# Patient Record
Sex: Female | Born: 1955 | Race: White | Hispanic: No | Marital: Married | Smoking: Never smoker
Health system: Southern US, Community
[De-identification: ages and names within clinical notes are randomized; demographics above are authoritative.]

## PROBLEM LIST (undated history)

## (undated) DIAGNOSIS — R011 Cardiac murmur, unspecified: Secondary | ICD-10-CM

## (undated) DIAGNOSIS — M810 Age-related osteoporosis without current pathological fracture: Secondary | ICD-10-CM

## (undated) DIAGNOSIS — N3090 Cystitis, unspecified without hematuria: Secondary | ICD-10-CM

## (undated) HISTORY — PX: TUBAL LIGATION: SHX77

## (undated) HISTORY — DX: Cardiac murmur, unspecified: R01.1

## (undated) HISTORY — PX: ABDOMINAL HYSTERECTOMY: SHX81

## (undated) HISTORY — DX: Age-related osteoporosis without current pathological fracture: M81.0

---

## 1998-11-06 ENCOUNTER — Ambulatory Visit (HOSPITAL_COMMUNITY): Admission: RE | Admit: 1998-11-06 | Discharge: 1998-11-06 | Payer: Self-pay | Admitting: Family Medicine

## 1998-11-06 ENCOUNTER — Encounter: Payer: Self-pay | Admitting: Family Medicine

## 1999-01-14 ENCOUNTER — Other Ambulatory Visit: Admission: RE | Admit: 1999-01-14 | Discharge: 1999-01-14 | Payer: Self-pay | Admitting: Obstetrics and Gynecology

## 1999-02-27 ENCOUNTER — Inpatient Hospital Stay (HOSPITAL_COMMUNITY): Admission: RE | Admit: 1999-02-27 | Discharge: 1999-03-01 | Payer: Self-pay | Admitting: Obstetrics and Gynecology

## 1999-03-11 ENCOUNTER — Ambulatory Visit (HOSPITAL_COMMUNITY): Admission: RE | Admit: 1999-03-11 | Discharge: 1999-03-11 | Payer: Self-pay | Admitting: Obstetrics and Gynecology

## 1999-03-11 ENCOUNTER — Encounter: Payer: Self-pay | Admitting: Obstetrics and Gynecology

## 2000-03-17 ENCOUNTER — Encounter: Payer: Self-pay | Admitting: Obstetrics and Gynecology

## 2000-03-17 ENCOUNTER — Ambulatory Visit (HOSPITAL_COMMUNITY): Admission: RE | Admit: 2000-03-17 | Discharge: 2000-03-17 | Payer: Self-pay | Admitting: Obstetrics and Gynecology

## 2000-07-01 ENCOUNTER — Other Ambulatory Visit: Admission: RE | Admit: 2000-07-01 | Discharge: 2000-07-01 | Payer: Self-pay | Admitting: Obstetrics and Gynecology

## 2001-03-29 ENCOUNTER — Encounter: Payer: Self-pay | Admitting: Obstetrics and Gynecology

## 2001-03-29 ENCOUNTER — Ambulatory Visit (HOSPITAL_COMMUNITY): Admission: RE | Admit: 2001-03-29 | Discharge: 2001-03-29 | Payer: Self-pay | Admitting: Obstetrics and Gynecology

## 2001-08-31 ENCOUNTER — Encounter (INDEPENDENT_AMBULATORY_CARE_PROVIDER_SITE_OTHER): Payer: Self-pay | Admitting: *Deleted

## 2001-08-31 ENCOUNTER — Ambulatory Visit (HOSPITAL_COMMUNITY): Admission: RE | Admit: 2001-08-31 | Discharge: 2001-08-31 | Payer: Self-pay | Admitting: Gastroenterology

## 2002-01-10 ENCOUNTER — Other Ambulatory Visit: Admission: RE | Admit: 2002-01-10 | Discharge: 2002-01-10 | Payer: Self-pay | Admitting: Obstetrics and Gynecology

## 2003-02-27 ENCOUNTER — Encounter: Payer: Self-pay | Admitting: Obstetrics and Gynecology

## 2003-02-27 ENCOUNTER — Ambulatory Visit (HOSPITAL_COMMUNITY): Admission: RE | Admit: 2003-02-27 | Discharge: 2003-02-27 | Payer: Self-pay | Admitting: Obstetrics and Gynecology

## 2004-07-09 ENCOUNTER — Encounter: Admission: RE | Admit: 2004-07-09 | Discharge: 2004-07-09 | Payer: Self-pay | Admitting: Obstetrics and Gynecology

## 2004-07-19 ENCOUNTER — Ambulatory Visit (HOSPITAL_COMMUNITY): Admission: RE | Admit: 2004-07-19 | Discharge: 2004-07-19 | Payer: Self-pay | Admitting: Gastroenterology

## 2005-08-05 ENCOUNTER — Encounter: Admission: RE | Admit: 2005-08-05 | Discharge: 2005-08-05 | Payer: Self-pay | Admitting: Obstetrics and Gynecology

## 2006-06-18 ENCOUNTER — Encounter: Admission: RE | Admit: 2006-06-18 | Discharge: 2006-06-18 | Payer: Self-pay | Admitting: Obstetrics and Gynecology

## 2006-08-26 ENCOUNTER — Encounter: Admission: RE | Admit: 2006-08-26 | Discharge: 2006-08-26 | Payer: Self-pay | Admitting: Obstetrics and Gynecology

## 2007-09-17 ENCOUNTER — Encounter: Admission: RE | Admit: 2007-09-17 | Discharge: 2007-09-17 | Payer: Self-pay | Admitting: Obstetrics and Gynecology

## 2008-09-21 ENCOUNTER — Ambulatory Visit (HOSPITAL_COMMUNITY): Admission: RE | Admit: 2008-09-21 | Discharge: 2008-09-21 | Payer: Self-pay | Admitting: Obstetrics and Gynecology

## 2008-10-25 ENCOUNTER — Encounter: Admission: RE | Admit: 2008-10-25 | Discharge: 2008-10-25 | Payer: Self-pay | Admitting: Dermatology

## 2009-05-15 ENCOUNTER — Encounter: Admission: RE | Admit: 2009-05-15 | Discharge: 2009-05-15 | Payer: Self-pay | Admitting: Interventional Radiology

## 2009-09-11 ENCOUNTER — Encounter: Admission: RE | Admit: 2009-09-11 | Discharge: 2009-09-11 | Payer: Self-pay | Admitting: Interventional Radiology

## 2009-11-21 ENCOUNTER — Encounter: Admission: RE | Admit: 2009-11-21 | Discharge: 2009-11-21 | Payer: Self-pay | Admitting: Interventional Radiology

## 2009-11-28 ENCOUNTER — Encounter: Admission: RE | Admit: 2009-11-28 | Discharge: 2009-11-28 | Payer: Self-pay | Admitting: Interventional Radiology

## 2009-12-26 ENCOUNTER — Encounter: Admission: RE | Admit: 2009-12-26 | Discharge: 2009-12-26 | Payer: Self-pay | Admitting: Interventional Radiology

## 2010-05-22 ENCOUNTER — Encounter: Admission: RE | Admit: 2010-05-22 | Discharge: 2010-05-22 | Payer: Self-pay | Admitting: Interventional Radiology

## 2010-10-24 ENCOUNTER — Ambulatory Visit (HOSPITAL_COMMUNITY): Admission: RE | Admit: 2010-10-24 | Discharge: 2010-10-24 | Payer: Self-pay | Admitting: Obstetrics and Gynecology

## 2011-01-11 ENCOUNTER — Encounter: Payer: Self-pay | Admitting: Obstetrics and Gynecology

## 2011-01-13 ENCOUNTER — Encounter: Payer: Self-pay | Admitting: Obstetrics and Gynecology

## 2011-01-14 IMAGING — US IR TRANSCATH EMBOLIZATION NON-NEURO EA OP FIELD
1 series · 4 of 4 positions shown · non-contrast
Comparison: none

CLINICAL DATA: Chronic venous insufficiency of both lower
extremities with more significant symptoms on the right.
Evaluation has demonstrated insufficiency of the great saphenous
vein and short saphenous vein.

[Series 1: ir transcath embolization non-neuro ea op field · 0.08mm/px · 4 of 4 slices shown]
[im 1/4]
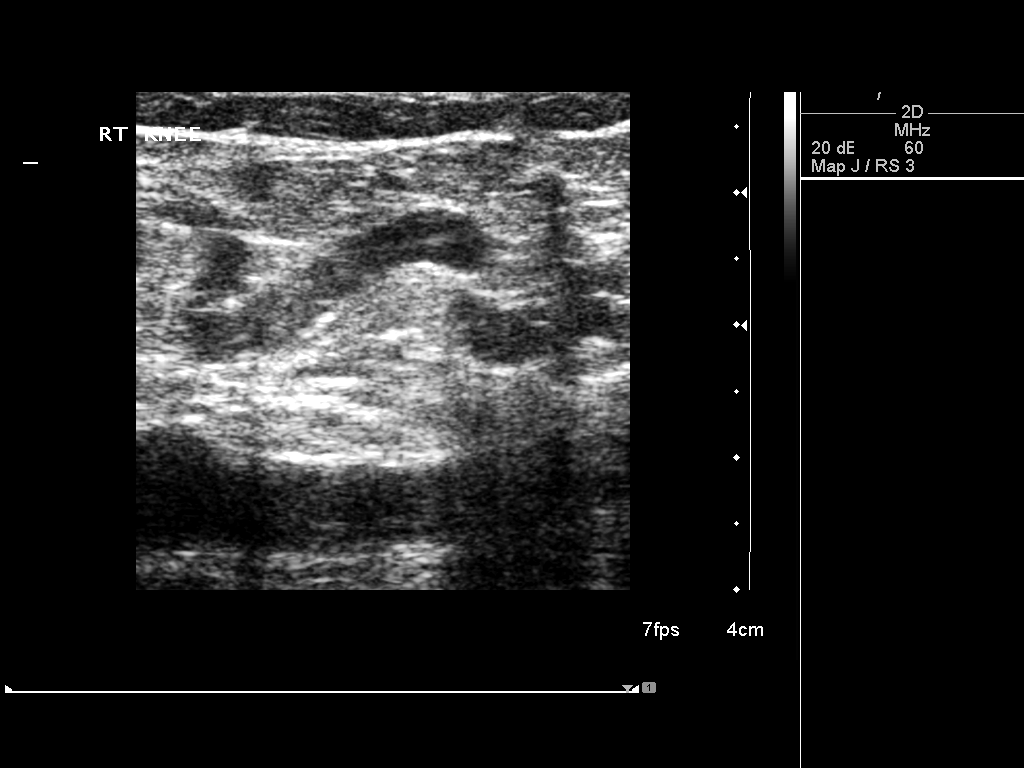
[im 2/4]
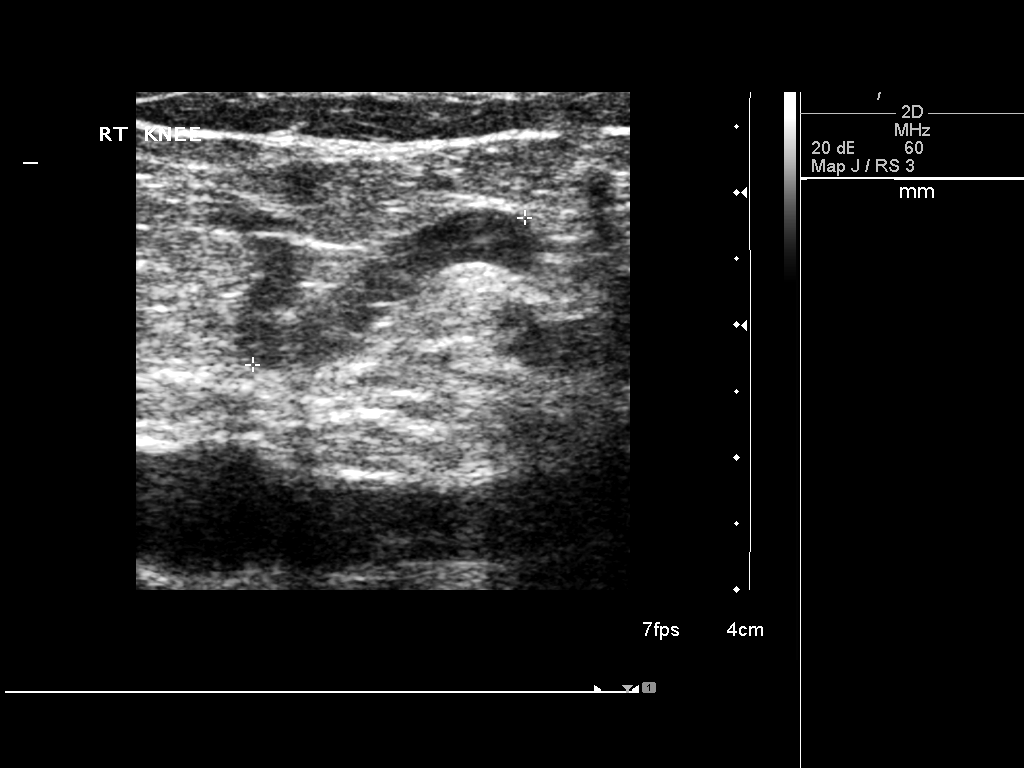
[im 3/4]
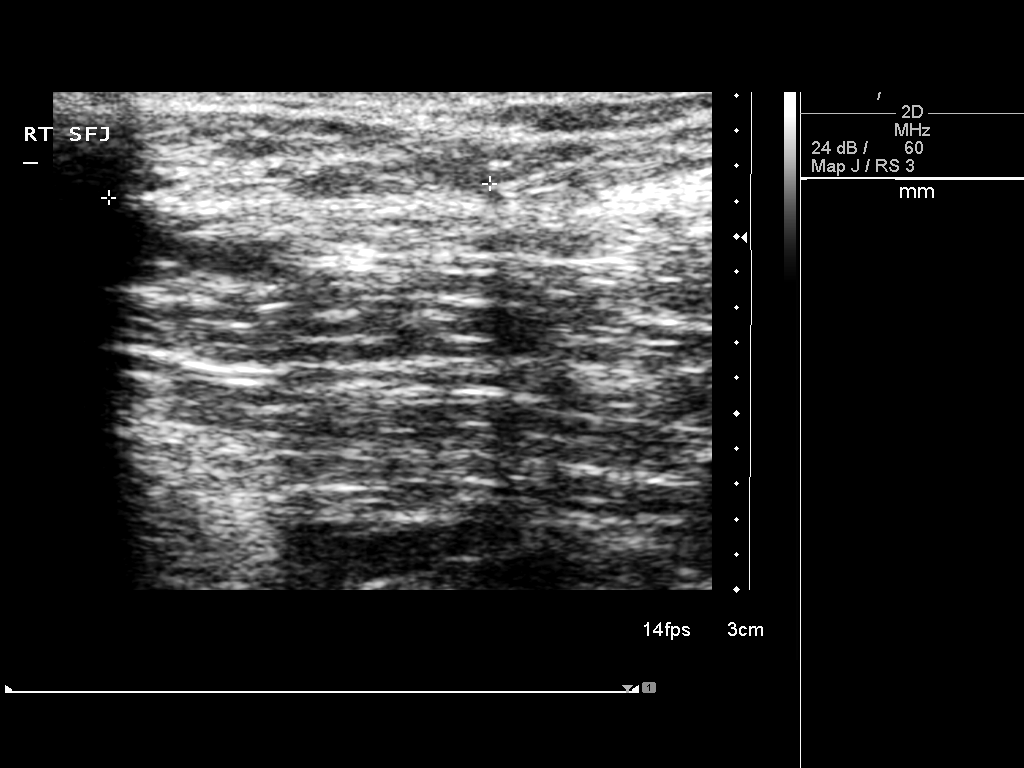
[im 4/4]
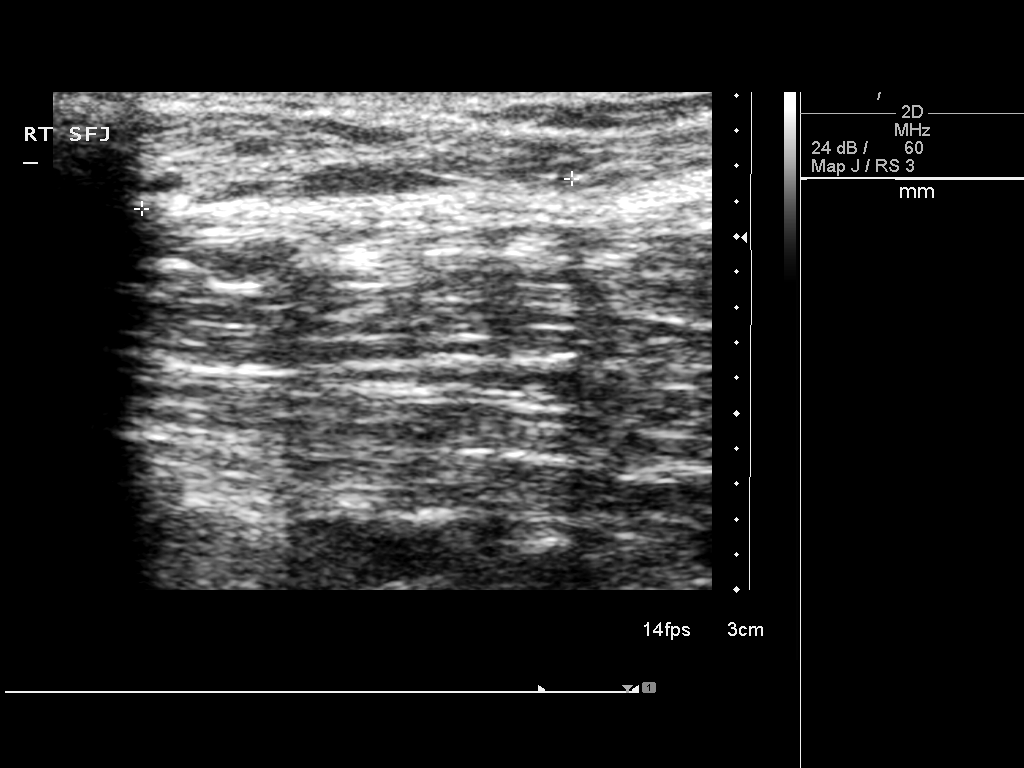

[4 of 4 positions shown; findings below may reference images not displayed]

1.  TRANSCATHETER OCCLUSION OF THE RIGHT GREAT SAPHENOUS VEIN
2.  ULTRASOUND-GUIDED FOAM SCLEROTHERAPY OF RIGHT SHORT SAPHENOUS
VEIN AND LOWER EXTREMITY VARICOSITIES

Procedure:  The procedure, risks, benefits, and alternatives were
explained to the patient.  Questions regarding the procedure were
encouraged and answered.  The patient understands and consents to
the procedure.

Ultrasound was used to localize the right great saphenous vein.
The course of the saphenous vein was marked on the skin.  The
extremity was prepped with Betadine in a sterile fashion, and a
sterile drape was applied covering the operative field.  A sterile
gown and sterile gloves were used for the procedure.  Local
anesthesia was provided with 1% Lidocaine.  Ultrasound image
documentation was performed.

Laser fiber diameter:  600 microns

Laser power:  10 Pabon

Total laser energy:  6199 joules

Total laser time:  278 seconds

Distance of laser tip from saphenofemoral junction:  25 mm

Tumescent anesthetic:  530 ml

Sclerosant:  2.0 ml SALEHI

Under direct ultrasound guidance, the great saphenous vein was
accessed at the distal calf level with a 21 gauge needle and
microaccess kit.  A guide wire was then advanced under ultrasound
guidance in the saphenous vein to the level of the saphenofemoral
junction.

A 60 cm, 4-French sheath was advanced over the wire and under
ultrasound guidance to the level of the saphenofemoral junction.
The sheath was slightly retracted.  A laser fiber was then
introduced and exposed from the tip of the sheath.  The laser fiber
was then adjusted and measurement made from the tip of the laser
fiber to the saphenofemoral junction.  This distance was documented
by ultrasound.

Tumescent anesthetic was then applied with the use of an automated
pump [DATE] gauge needle.  Anesthetic was applied along the
entire treatment course of the saphenous vein all the way to the
level of the saphenofemoral junction.  Ultrasound documentation was
performed during tumescent administration to follow spread of
anesthetic along the course of the saphenous vein.

Laser ablation of the saphenous vein was then performed by gradual
retraction of the laser fiber and sheath.  Ablation was performed
to the level of the sheath entry site.  The sheath and laser were
removed and manual compression held over the skin puncture site.

Foam sclerotherapy was performed of the short saphenous vein behind
the knee as well as adjacent communicating perforator vein and
varicosities.  Foamed SALEHI was injected under direct ultrasound
guidance into patent varicosities.

Complications:  None
FINDINGS: Laser ablation of the great saphenous vein was
uncomplicated.  The saphenous vein was punctured as low as possible
for treatment.  Total treatment length of the great saphenous vein
was 57 cm.

Inspection of the short saphenous vein demonstrates significant
tortuosity with lack of sufficient straight segment to allow
advancement of a laser fiber.  The longest straight segment
measured only 2.3 cm in length.  Foam sclerotherapy was therefore
performed of a segment of insufficient saphenous vein behind the
knee.  Foam sclerosant was also injected into a communicating
perforator vein and superficial varicosities at the level of the
popliteal fossa and extending just into the proximal posterior
calf.
IMPRESSION: Successful transcatheter laser ablation of the right great
saphenous vein to treat venous insufficiency.  Additional foam
sclerotherapy was performed of the short saphenous vein,
communicating varicosities and a communicating perforator vein
under ultrasound guidance.

## 2011-05-09 NOTE — Op Note (Signed)
NAME:  Angie Davis, Angie Davis                            ACCOUNT NO.:  [id number]   MEDICAL RECORD NO.:  [id number]                   PATIENT TYPE:  AMB   LOCATION:  ENDO                                 FACILITY:  [facility]   PHYSICIAN:  Anselmo Rod, M.D.               DATE OF BIRTH:  05/12/1956   DATE OF PROCEDURE:  07/19/2004  DATE OF DISCHARGE:                                 OPERATIVE REPORT   PROCEDURE PERFORMED:  Screening colonoscopy.   ENDOSCOPIST:  Charna Elizabeth, M.D.   INSTRUMENT USED:  Olympus video colonoscope.   INDICATIONS FOR PROCEDURE:  The patient is a 55 year old white female with a  personal history of adenomatous polyps removed three years ago.  Undergoing  a complete colonoscopy to rule out recurrent polyps.  The patient has had a  history of change in bowel habits with abdominal pain in the recent past.   PREPROCEDURE PREPARATION:  Informed consent was procured from the patient.  The patient was fasted for eight hours prior to the procedure and prepped  with a bottle of MiraLax and Gatorade the night prior to the procedure.   PREPROCEDURE PHYSICAL:  The patient had stable vital signs.  Neck supple.  Chest clear to auscultation.  S1 and S2 regular.  Abdomen soft with normal  bowel sounds.   DESCRIPTION OF PROCEDURE:  The patient was placed in left lateral decubitus  position and sedated with 75 mg of Demerol and 7.5 mg of Versed in slow  incremental doses.  Once the patient was adequately sedated and maintained  on low flow oxygen and continuous cardiac monitoring, the Olympus video  colonoscope was advanced from the rectum to the cecum.  The appendicular  orifice and ileocecal valve were clearly visualized and photographed.  An  isolated diverticulum was seen in the distal right colon.  Small internal  hemorrhoids were appreciated on retroflexion in the rectum.  The patient had  a very tortuous colon, but the mucosa was healthy.  The patient tolerated  the procedure  well without complications.   IMPRESSION:  1. Small internal hemorrhoids.  Otherwise unrevealing colonoscopy.  2. Isolated diverticulum in distal right colon.   RECOMMENDATIONS:  1. Continue high fiber diet with liberal fluid intake.  2. Outpatient followup in the next two weeks for further recommendations.  3. Repeat colonoscopy is recommended in the next five years unless the     patient develops any abnormal symptoms in the interim.                                               Anselmo Rod, M.D.    JNM/MEDQ  D:  07/19/2004  T:  07/19/2004  Job:  [id number]   cc:   Meredith Staggers, M.D.  [phone]  [address], [address]  [place]  [place] [address]  Fax: [phone]   Eliberto Ivory. Rosalio Macadamia, M.D.  [address]  [place]  [place] [address]  Fax: [phone]

## 2011-05-09 NOTE — Procedures (Signed)
[facility]  Patient:    Angie Davis, TILLIS Visit Number: [id number] MRN: [id number]          Service Type: END Location: ENDO Attending Physician:  Charna Elizabeth Dictated by:   Anselmo Rod, M.D. Proc. Date: 08/31/01 Admit Date:  08/31/2001   CC:         Meredith Staggers, M.D.   Procedure Report  DATE OF BIRTH:  [birth year]  REFERRING PHYSICIAN:  Meredith Staggers, M.D.  PROCEDURE PERFORMED:  Colonoscopy with hot biopsies x 2.  ENDOSCOPIST:  Anselmo Rod, M.D.  INSTRUMENT USED:  Olympus video pediatric colonoscope.  INDICATIONS FOR PROCEDURE:  Family history of colon cancer in a 55 year old white female Rule out colonic polyps, masses, hemorrhoids, etc.  PREPROCEDURE PREPARATION:  Informed consent was procured from the patient. The patient was fasted for eight hours prior to the procedure and prepped with a bottle of magnesium citrate and a gallon of NuLytely the night prior to the procedure.  PREPROCEDURE PHYSICAL:  The patient had stable vital signs.  Neck supple. Chest clear to auscultation.  S1, S2 regular.  Abdomen soft with normal abdominal bowel sounds.  DESCRIPTION OF PROCEDURE:  The patient was placed in the left lateral decubitus position and sedated with 30 mg of Demerol and 4.5 mg of Versed intravenously.  Once the patient was adequately sedated and maintained on low-flow oxygen and continuous cardiac monitoring, the Olympus video colonoscope was advanced from the rectum to the cecum without difficulty.  The patient had a good prep.  There was a small sessile polyp that hot biopsied from the right colon just distal to the cecum.  No other masses or polyps were seen.  Appendicular orifice and the ileocecal valve were clearly visualized and photographed.  IMPRESSION: 1. A healthy-appearing colon except for a small polyp hot biopsied from the    right colon just distal to the cecum. 2. Small internal  hemorrhoids.  RECOMMENDATIONS: 1. Await pathology results. 2. Repeat colorectal cancer screening depending on the pathology results. 3. Outpatient follow-up on a p.r.n. basis. 4. High fiber diet.Dictated by:   Anselmo Rod, M.D. Attending Physician:  Charna Elizabeth DD:  08/31/01 TD:  08/31/01 Job: [id number]

## 2011-10-07 ENCOUNTER — Other Ambulatory Visit (HOSPITAL_COMMUNITY): Payer: Self-pay | Admitting: Obstetrics and Gynecology

## 2011-10-07 DIAGNOSIS — Z1231 Encounter for screening mammogram for malignant neoplasm of breast: Secondary | ICD-10-CM

## 2011-10-28 ENCOUNTER — Ambulatory Visit (HOSPITAL_COMMUNITY): Payer: Self-pay

## 2011-11-20 ENCOUNTER — Ambulatory Visit (HOSPITAL_COMMUNITY): Payer: Self-pay

## 2012-02-27 ENCOUNTER — Other Ambulatory Visit (HOSPITAL_COMMUNITY): Payer: Self-pay | Admitting: Obstetrics and Gynecology

## 2012-02-27 DIAGNOSIS — M818 Other osteoporosis without current pathological fracture: Secondary | ICD-10-CM

## 2012-02-27 DIAGNOSIS — M899 Disorder of bone, unspecified: Secondary | ICD-10-CM

## 2012-03-25 ENCOUNTER — Ambulatory Visit (HOSPITAL_COMMUNITY)
Admission: RE | Admit: 2012-03-25 | Discharge: 2012-03-25 | Disposition: A | Discharge status: Home / Self Care | Payer: 59 | Source: Ambulatory Visit | Attending: Obstetrics and Gynecology | Admitting: Obstetrics and Gynecology

## 2012-03-25 DIAGNOSIS — Z1231 Encounter for screening mammogram for malignant neoplasm of breast: Secondary | ICD-10-CM | POA: Insufficient documentation

## 2012-03-25 DIAGNOSIS — M818 Other osteoporosis without current pathological fracture: Secondary | ICD-10-CM

## 2012-03-25 DIAGNOSIS — M899 Disorder of bone, unspecified: Secondary | ICD-10-CM

## 2021-08-28 ENCOUNTER — Encounter (HOSPITAL_COMMUNITY): Payer: Self-pay | Admitting: Emergency Medicine

## 2021-08-28 ENCOUNTER — Other Ambulatory Visit: Payer: Self-pay

## 2021-08-28 ENCOUNTER — Ambulatory Visit (HOSPITAL_COMMUNITY)
Admission: EM | Admit: 2021-08-28 | Discharge: 2021-08-28 | Disposition: A | Discharge status: Home / Self Care | Payer: No Typology Code available for payment source

## 2021-08-28 DIAGNOSIS — R1032 Left lower quadrant pain: Secondary | ICD-10-CM | POA: Diagnosis not present

## 2021-08-28 HISTORY — DX: Cystitis, unspecified without hematuria: N30.90

## 2021-08-28 NOTE — ED Provider Notes (Signed)
[facility]    CSN: [id number] Arrival date & time: 08/28/21  1824      History   Chief Complaint Chief Complaint  Patient presents with   Abdominal Pain    HPI Angie Davis is a 65 y.o. female.   66 yr old female pt, Angie Davis, presents to UC with chief complaint of LLQ pain x 1 day. Pt reports hx of diverticulitis, constipation, Pt requesting Cipro and flagyl as has no PCP. Pt denies fever,nausea,vomiting.   The history is provided by the patient. No language interpreter was used.   Past Medical History:  Diagnosis Date   Cystitis     Patient Active Problem List   Diagnosis Date Noted   LLQ pain 08/28/2021    Past Surgical History:  Procedure Laterality Date   ABDOMINAL HYSTERECTOMY     TUBAL LIGATION      OB History   No obstetric history on file.      Home Medications    Prior to Admission medications   Medication Sig Start Date End Date Taking? Authorizing Provider  Multiple Vitamin (MULTIVITAMIN) capsule Take 1 capsule by mouth daily.   Yes [provider]    Family History No family history on file.  Social History Social History   Tobacco Use   Smoking status: Never   Smokeless tobacco: Never     Allergies   Patient has no known allergies.   Review of Systems Review of Systems  Constitutional:  Negative for fever.  Gastrointestinal:  Positive for abdominal pain and constipation. Negative for nausea and vomiting.  All other systems reviewed and are negative.   Physical Exam Triage Vital Signs ED Triage Vitals  Enc Vitals Group     BP 08/28/21 1906 96/61     Pulse Rate 08/28/21 1906 80     Resp 08/28/21 1906 16     Temp 08/28/21 1906 98.5 F (36.9 C)     Temp Source 08/28/21 1906 Oral     SpO2 08/28/21 1906 96 %     Weight --      Height --      Head Circumference --      Peak Flow --      Pain Score 08/28/21 1903 6     Pain Loc --      Pain Edu? --      Excl. in GC? --    No data  found.  Updated Vital Signs BP 96/61 (BP Location: Left Arm)   Pulse 80   Temp 98.5 F (36.9 C) (Oral)   Resp 16   SpO2 96%   Visual Acuity Right Eye Distance:   Left Eye Distance:   Bilateral Distance:    Right Eye Near:   Left Eye Near:    Bilateral Near:     Physical Exam Vitals and nursing note reviewed.  Constitutional:      Appearance: Normal appearance. She is well-developed, well-groomed and normal weight.  Abdominal:     General: Bowel sounds are increased.     Tenderness: There is abdominal tenderness in the left lower quadrant. There is no rebound.  Neurological:     Mental Status: She is alert.  Psychiatric:        Behavior: Behavior is cooperative.     UC Treatments / Results  Labs (all labs ordered are listed, but only abnormal results are displayed) Labs Reviewed - No data to display  EKG   Radiology No results found.  Procedures Procedures (including critical care time)  Medications Ordered in UC Medications - No data to display  Initial Impression / Assessment and Plan / UC Course  I have reviewed the triage vital signs and the nursing notes.  Pertinent labs & imaging results that were available during my care of the patient were reviewed by me and considered in my medical decision making (see chart for details).     DDX: Viral illness, Diverticulosis, diverticulitis, constipation, cystitis Final Clinical Impressions(s) / UC Diagnoses   Final diagnoses:  LLQ pain     Discharge Instructions      Discussed with pt need for further evaluation of LLQ pain, recommend evaluation at ER: Labs, CT scan to determine cause of pain. Antibiotics not indicated with 1 day sudden onset of LLQ pain without known cause. Pt verbalized understanding to this provider.      ED Prescriptions   None    PDMP not reviewed this encounter.   Clancy Gourd, NP 08/28/21 2030

## 2021-08-28 NOTE — ED Notes (Signed)
Pt declined further evaluation in ED

## 2021-08-28 NOTE — Discharge Instructions (Addendum)
Discussed with pt need for further evaluation of LLQ pain, recommend evaluation at ER: Labs, CT scan to determine cause of pain. Antibiotics not indicated with 1 day sudden onset of LLQ pain without known cause. Pt verbalized understanding to this provider.

## 2021-08-28 NOTE — ED Triage Notes (Signed)
Pt reports will have flare up of diverticulitis every couple years. Reports LLQ pains since last night. Had some issues with constipation.  Pt repots works at [employer] and had a covid test today.

## 2023-07-08 ENCOUNTER — Encounter: Payer: Self-pay | Admitting: Family Medicine

## 2023-07-08 ENCOUNTER — Ambulatory Visit: Payer: Medicare Other | Admitting: Family Medicine

## 2023-07-08 VITALS — BP 113/70 | HR 53 | Temp 97.8°F | Ht 66.0 in | Wt 122.6 lb

## 2023-07-08 DIAGNOSIS — R739 Hyperglycemia, unspecified: Secondary | ICD-10-CM | POA: Diagnosis not present

## 2023-07-08 DIAGNOSIS — E785 Hyperlipidemia, unspecified: Secondary | ICD-10-CM | POA: Diagnosis not present

## 2023-07-08 DIAGNOSIS — Z1231 Encounter for screening mammogram for malignant neoplasm of breast: Secondary | ICD-10-CM

## 2023-07-08 DIAGNOSIS — M810 Age-related osteoporosis without current pathological fracture: Secondary | ICD-10-CM

## 2023-07-08 DIAGNOSIS — R002 Palpitations: Secondary | ICD-10-CM | POA: Diagnosis not present

## 2023-07-08 DIAGNOSIS — N3090 Cystitis, unspecified without hematuria: Secondary | ICD-10-CM | POA: Diagnosis not present

## 2023-07-08 DIAGNOSIS — G4700 Insomnia, unspecified: Secondary | ICD-10-CM

## 2023-07-08 DIAGNOSIS — L439 Lichen planus, unspecified: Secondary | ICD-10-CM

## 2023-07-08 LAB — LIPID PANEL
Cholesterol: 205 mg/dL — ABNORMAL HIGH (ref 0–200)
HDL: 74.1 mg/dL (ref >39.00)
LDL Cholesterol: 119 mg/dL — ABNORMAL HIGH (ref 0–99)
NonHDL: 130.55
Total CHOL/HDL Ratio: 3
Triglycerides: 59 mg/dL (ref 0.0–149.0)
VLDL: 11.8 mg/dL (ref 0.0–40.0)

## 2023-07-08 LAB — HEMOGLOBIN A1C: Hgb A1c MFr Bld: 6 % (ref 4.6–6.5)

## 2023-07-08 LAB — CBC
HCT: 40.3 % (ref 36.0–46.0)
Hemoglobin: 13.2 g/dL (ref 12.0–15.0)
MCHC: 32.8 g/dL (ref 30.0–36.0)
MCV: 89.7 fl (ref 78.0–100.0)
Platelets: 232 10*3/uL (ref 150.0–400.0)
RBC: 4.49 Mil/uL (ref 3.87–5.11)
RDW: 13.1 % (ref 11.5–15.5)
WBC: 6.7 10*3/uL (ref 4.0–10.5)

## 2023-07-08 LAB — COMPREHENSIVE METABOLIC PANEL
ALT: 15 U/L (ref 0–35)
AST: 22 U/L (ref 0–37)
Albumin: 4.4 g/dL (ref 3.5–5.2)
Alkaline Phosphatase: 74 U/L (ref 39–117)
BUN: 13 mg/dL (ref 6–23)
CO2: 28 mEq/L (ref 19–32)
Calcium: 9.5 mg/dL (ref 8.4–10.5)
Chloride: 103 mEq/L (ref 96–112)
Creatinine, Ser: 0.65 mg/dL (ref 0.40–1.20)
GFR: 92.18 mL/min (ref >60.00)
Glucose, Bld: 82 mg/dL (ref 70–99)
Potassium: 4.6 mEq/L (ref 3.5–5.1)
Sodium: 137 mEq/L (ref 135–145)
Total Bilirubin: 0.5 mg/dL (ref 0.2–1.2)
Total Protein: 7.2 g/dL (ref 6.0–8.3)

## 2023-07-08 LAB — TSH: TSH: 1.03 u[IU]/mL (ref 0.35–5.50)

## 2023-07-08 NOTE — Assessment & Plan Note (Signed)
A1c last year 5.8.  Will recheck today.  She is doing a great job with diet and exercise.

## 2023-07-08 NOTE — Assessment & Plan Note (Signed)
Stable on clobetasol as needed

## 2023-07-08 NOTE — Progress Notes (Signed)
Angie Davis is a 68 y.o. female who presents today for an office visit.  She is a new patient.   Assessment/Plan:  Chronic Problems Addressed Today: Insomnia I had a lengthy discussion with patient today regarding her insomnia.  We did discuss sleep hygiene measures.  She is taking a good job with this did recommend that she stay and bed only 5 to 10 minutes if not asleep.  She can continue using Benadryl as needed but also recommended Unisom.  She has tried a couple of prescription medication in the past including trazodone and Vistaril which have not been effective.  If symptoms do not improve with above would consider trial of doxepin, gabapentin, or Ambien.  Palpitation Able to review her Apple Watch readings in the office today.  She is having occasional PAC.  Reassuring exam today.  We did discuss potential Holter monitoring however given benign symptoms and benign appearance on Apple watch monitor do not think this would be necessary at this point.  We will check labs and she will continue to monitor.  We did discuss avoidance of triggers including caffeine, alcohol, dehydration, sleep deprivation, etc.  Recurrent cystitis Follows with urogyn for this.  Currently on methenamine daily which seems to be going well.  Hyperglycemia A1c last year 5.8.  Will recheck today.  She is doing a great job with diet and exercise.  Osteoporosis Last DEXA 2022 Diagnosed by GYN a couple of years ago.  She would like for [place] to take over management.  Will obtain records regarding the most recent DEXA.  We will repeat DEXA later this year when she is due.  We discussed calcium and vitamin D supplementation.  She is also on Fosamax 70 mg daily which she will continue for now depending on results of upcoming DEXA.  Dyslipidemia Check lipids.  Discussed lifestyle modifications.  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  Preventative health care Declined pneumonia vaccine.  Obtain records from  previous PCP regarding other preventive healthcare items.    Subjective:  HPI:  See Assessment / plan for status of chronic conditions. She is here to establish care as a new a patient.  Previous PCP was GYN   She has a few issues that she would like to discuss today.  She has been having ongoing issues with insomnia for the last several months.  She is having trouble both with falling asleep and staying asleep.  Sometimes will wake up in the middle of the night and have difficulty falling back asleep.  She has taken Benadryl sporadically for this which does help but recently has noted that she has had to take Benadryl almost nightly.  She has had other medications for this in the past including trazodone and Vistaril but this has not been effective.  She also is getting occasional palpitations in her chest.  She is wearing a Apple watch which showed sinus rhythm though does look like she is having a few skipped beats.  No chest pain.  No shortness of breath.  Symptoms happen randomly.  Spontaneously subside.       Objective:  Physical Exam: BP 113/70   Pulse (!) 53   Temp 97.8 F (36.6 C) (Temporal)   Ht 5\' 6"  (1.676 m)   Wt 122 lb 9.6 oz (55.6 kg)   SpO2 100%   BMI 19.79 kg/m   Gen: No acute distress, resting comfortably CV: Regular rate and rhythm with no murmurs appreciated Pulm: Normal work of breathing,  clear to auscultation bilaterally with no crackles, wheezes, or rhonchi Neuro: Grossly normal, moves all extremities Psych: Normal affect and thought content      Angie Davis M. Jimmey Ralph, MD 07/08/2023 10:03 AM

## 2023-07-08 NOTE — Assessment & Plan Note (Signed)
 Check lipids. Discussed lifestyle modifications.  

## 2023-07-08 NOTE — Assessment & Plan Note (Signed)
Able to review her Apple Watch readings in the office today.  She is having occasional PAC.  Reassuring exam today.  We did discuss potential Holter monitoring however given benign symptoms and benign appearance on Apple watch monitor do not think this would be necessary at this point.  We will check labs and she will continue to monitor.  We did discuss avoidance of triggers including caffeine, alcohol, dehydration, sleep deprivation, etc.

## 2023-07-08 NOTE — Patient Instructions (Signed)
It was very nice to see you today!  We will check blood work today.  We will also order your mammogram and bone density scan.  Please try to incorporate the following into your daily routine:  1. Sleep only long enough to feel rested and then get out of bed  2. Go to bed and get up at the same time every day  3. Do not try to force yourself to sleep. If you can't sleep, get out of bed and try again later.  4. Have coffee, tea, and other foods that have caffeine only in the morning  5. Avoid alcohol in the late afternoon, evening, and bedtime  6. Avoid smoking, especially in the evening  7. Keep your bedroom dark, cool, quiet, and free of reminders of work or other things that cause you stress  8. Solve problems you have before you go to bed  9. Exercise several days a week, but not right before bed  10. Avoid looking at phones or reading devices ("e-books") that give off light before bed. This can make it harder to fall asleep.   Return in about 1 year (around 07/07/2024) for Annual Physical.   Take care, Dr Jimmey Ralph  PLEASE NOTE:  If you had any lab tests, please let us know if you have not heard back within a few days. You may see your results on mychart before we have a chance to review them but we will give you a call once they are reviewed by [place].   If we ordered any referrals today, please let us know if you have not heard from their office within the next week.   If you had any urgent prescriptions sent in today, please check with the pharmacy within an hour of our visit to make sure the prescription was transmitted appropriately.   Please try these tips to maintain a healthy lifestyle:  Eat at least 3 REAL meals and 1-2 snacks per day.  Aim for no more than 5 hours between eating.  If you eat breakfast, please do so within one hour of getting up.   Each meal should contain half fruits/vegetables, one quarter protein, and one quarter carbs (no bigger than a computer  mouse)  Cut down on sweet beverages. This includes juice, soda, and sweet tea.   Drink at least 1 glass of water with each meal and aim for at least 8 glasses per day  Exercise at least 150 minutes every week.

## 2023-07-08 NOTE — Assessment & Plan Note (Signed)
I had a lengthy discussion with patient today regarding her insomnia.  We did discuss sleep hygiene measures.  She is taking a good job with this did recommend that she stay and bed only 5 to 10 minutes if not asleep.  She can continue using Benadryl as needed but also recommended Unisom.  She has tried a couple of prescription medication in the past including trazodone and Vistaril which have not been effective.  If symptoms do not improve with above would consider trial of doxepin, gabapentin, or Ambien.

## 2023-07-08 NOTE — Assessment & Plan Note (Signed)
Follows with urogyn for this.  Currently on methenamine daily which seems to be going well.

## 2023-07-08 NOTE — Assessment & Plan Note (Signed)
Diagnosed by GYN a couple of years ago.  She would like for [place] to take over management.  Will obtain records regarding the most recent DEXA.  We will repeat DEXA later this year when she is due.  We discussed calcium and vitamin D supplementation.  She is also on Fosamax 70 mg daily which she will continue for now depending on results of upcoming DEXA.

## 2023-07-13 NOTE — Progress Notes (Signed)
Her cholesterol and blood sugar are borderline elevated but do not need to make any changes to treatment plan at this time.  She should continue to work on diet and exercise and we can recheck everything in a year or so.

## 2023-08-06 ENCOUNTER — Ambulatory Visit (HOSPITAL_BASED_OUTPATIENT_CLINIC_OR_DEPARTMENT_OTHER)
Admission: RE | Admit: 2023-08-06 | Discharge: 2023-08-06 | Disposition: A | Discharge status: Home / Self Care | Payer: Medicare Other | Source: Ambulatory Visit | Attending: Family Medicine | Admitting: Family Medicine

## 2023-08-06 DIAGNOSIS — Z1231 Encounter for screening mammogram for malignant neoplasm of breast: Secondary | ICD-10-CM

## 2023-08-06 DIAGNOSIS — M810 Age-related osteoporosis without current pathological fracture: Secondary | ICD-10-CM | POA: Diagnosis present

## 2023-08-11 NOTE — Progress Notes (Signed)
Her bone density scan shows that she has a T-score of -2.4.  This is in the osteopenic range.  I do not have her previous her to compare with.  We should probably continue with her Fosamax for now and repeat in 2 years.  She should continue calcium and vitamin D supplementation.

## 2023-10-06 ENCOUNTER — Ambulatory Visit: Payer: Medicare Other

## 2023-10-23 ENCOUNTER — Ambulatory Visit (INDEPENDENT_AMBULATORY_CARE_PROVIDER_SITE_OTHER): Payer: Medicare Other | Admitting: Family Medicine

## 2023-10-23 VITALS — BP 100/60 | HR 55 | Temp 98.0°F | Ht 66.0 in | Wt 121.2 lb

## 2023-10-23 DIAGNOSIS — Z23 Encounter for immunization: Secondary | ICD-10-CM | POA: Diagnosis not present

## 2023-10-23 DIAGNOSIS — M810 Age-related osteoporosis without current pathological fracture: Secondary | ICD-10-CM

## 2023-10-23 DIAGNOSIS — R002 Palpitations: Secondary | ICD-10-CM | POA: Diagnosis not present

## 2023-10-23 MED ORDER — ALENDRONATE SODIUM 70 MG PO TABS: 70.0000 mg | ORAL_TABLET | ORAL | 3 refills | Status: DC

## 2023-10-23 NOTE — Assessment & Plan Note (Signed)
Last DEXA scan showed a T-score of -2.4.  Will continue Fosamax 70 mg weekly.  Repeat DEXA scan in 2 years.

## 2023-10-23 NOTE — Progress Notes (Signed)
   Angie Davis is a 67 y.o. female who presents today for an office visit.  Assessment/Plan:  Chronic Problems Addressed Today: Palpitation Reviewed her Apple Watch readings today which were consistent with brief nonsustained ventricular tachycardia.  She has not had any recurrence of symptoms since her episode.  She has excellent functional status -doubt that she is having any sort of ischemia however agree with her seeking cardiac workup at this point.  She has already reached out to cardiology and will be seeing them next week.  Will place referral today if needed.  She is aware of reasons to return to care and seek emergent care.  Osteoporosis Last DEXA 2022 Last DEXA scan showed a T-score of -2.4.  Will continue Fosamax 70 mg weekly.  Repeat DEXA scan in 2 years.  Preventative health care-flu shot given today.    Subjective:  HPI:  See Assessment / plan for status of chronic conditions. Patient is here today with concern for heart palpitations. She was getting into her car at the grocery store when she started feeling palpitations that were different than her normal palpitations.  She took an EKG on her Apple Watch and was concerned that the readings were consistent with ventricular tachycardia.  Her palpitations started in her chest but then felt like they were going into her head.  This resolved after a few seconds.  Did not have any chest pain or shortness of breath.  No dizziness.  No syncope.  Due to her consuming reading on her Apple Watch she did reach out to her friend who is a cardiologist (Dr Graciela Husbands) who recommended that she come in to be evaluated.  She will be seeing them next week.  She has not had any recurrence of symptoms.  She has excellent functional status and is an avid bike rider.  She has not had any chest pain or shortness of breath with exertion.       Objective:  Physical Exam: BP 100/60   Pulse (!) 55   Temp 98 F (36.7 C) (Temporal)   Ht 5\' 6"  (1.676 m)    Wt 121 lb 3.2 oz (55 kg)   SpO2 98%   BMI 19.56 kg/m   Gen: No acute distress, resting comfortably CV: Regular rate and rhythm with no murmurs appreciated Pulm: Normal work of breathing, clear to auscultation bilaterally with no crackles, wheezes, or rhonchi Neuro: Grossly normal, moves all extremities Psych: Normal affect and thought content      Isami Mehra M. Jimmey Ralph, MD 10/23/2023 10:12 AM

## 2023-10-23 NOTE — Patient Instructions (Signed)
It was very nice to see you today!  We will refer you to see cardiology.  I will refill your Fosamax.  Let us know if we can be of any further assistance.  Return if symptoms worsen or fail to improve.   Take care, Dr Jimmey Ralph  PLEASE NOTE:  If you had any lab tests, please let us know if you have not heard back within a few days. You may see your results on mychart before we have a chance to review them but we will give you a call once they are reviewed by [place].   If we ordered any referrals today, please let us know if you have not heard from their office within the next week.   If you had any urgent prescriptions sent in today, please check with the pharmacy within an hour of our visit to make sure the prescription was transmitted appropriately.   Please try these tips to maintain a healthy lifestyle:  Eat at least 3 REAL meals and 1-2 snacks per day.  Aim for no more than 5 hours between eating.  If you eat breakfast, please do so within one hour of getting up.   Each meal should contain half fruits/vegetables, one quarter protein, and one quarter carbs (no bigger than a computer mouse)  Cut down on sweet beverages. This includes juice, soda, and sweet tea.   Drink at least 1 glass of water with each meal and aim for at least 8 glasses per day  Exercise at least 150 minutes every week.

## 2023-10-23 NOTE — Assessment & Plan Note (Signed)
Reviewed her Apple Watch readings today which were consistent with brief nonsustained ventricular tachycardia.  She has not had any recurrence of symptoms since her episode.  She has excellent functional status -doubt that she is having any sort of ischemia however agree with her seeking cardiac workup at this point.  She has already reached out to cardiology and will be seeing them next week.  Will place referral today if needed.  She is aware of reasons to return to care and seek emergent care.

## 2023-10-27 ENCOUNTER — Ambulatory Visit: Payer: Medicare Other

## 2023-10-27 ENCOUNTER — Ambulatory Visit: Payer: Medicare Other | Attending: Internal Medicine | Admitting: Internal Medicine

## 2023-10-27 ENCOUNTER — Encounter: Payer: Self-pay | Admitting: Internal Medicine

## 2023-10-27 VITALS — BP 110/62 | HR 55 | Ht 66.5 in | Wt 121.2 lb

## 2023-10-27 DIAGNOSIS — I472 Ventricular tachycardia, unspecified: Secondary | ICD-10-CM

## 2023-10-27 DIAGNOSIS — R002 Palpitations: Secondary | ICD-10-CM

## 2023-10-27 DIAGNOSIS — R9431 Abnormal electrocardiogram [ECG] [EKG]: Secondary | ICD-10-CM

## 2023-10-27 NOTE — Progress Notes (Signed)
ELECTROPHYSIOLOGY OFFICE  NOTE  Patient ID: Angie Davis, MRN: [id number], DOB/AGE: [birth year] 67 y.o. Admit date: (Not on file) Date of Consult: 10/27/2023  Primary Physician: Ardith Dark, MD Primary Cardiologist: NEW     Angie Davis is a 67 y.o. female who is being seen today for the evaluation of NONSUSTAINED Ventricular tachycardia .    HPI Angie Davis is a 67 y.o. female avid athlete who comes in with complaints of "butterflies" as well as palpitations which on her Apple Watch were recorded as nonsustained ventricular tachycardia.  The patient denies chest pain, shortness of breath, nocturnal dyspnea, orthopnea or peripheral edema.  There have been no  lightheadedness or syncope.    The butterflies occurred both early in the day late in the day, last seconds.  Apple watch recordings show a pause.  No associated symptoms.  Nonsustained ventricular tachycardia was recorded when she had palpitations that were frog positive distinct from the others  Alcohol intake 8 ounces of wine plus per night  Date Cr K Hgb LDL  7/24 0.65 4.6 13.2 119             Past Medical History:  Diagnosis Date   Cystitis    Heart murmur    Osteoporosis       Surgical History:  Past Surgical History:  Procedure Laterality Date   ABDOMINAL HYSTERECTOMY     TUBAL LIGATION       Home Meds: Current Meds  Medication Sig   alendronate (FOSAMAX) 70 MG tablet Take 1 tablet (70 mg total) by mouth once a week.   estradiol (ESTRACE) 0.1 MG/GM vaginal cream Insert blueberry size amount of cream on finger (or 0.5 grams with applicator) in vagina daily x2 week then 2x per week.   Magnesium 400 MG CAPS    MAGNESIUM PO Take by mouth.   methenamine (HIPREX) 1 g tablet Take by mouth.    Allergies: No Known Allergies  Social History   Socioeconomic History   Marital status: Married    Spouse name: Not on file   Number of children: Not on file   Years of education: Not on file    Highest education level: Master's degree (e.g., MA, MS, MEng, MEd, MSW, MBA)  Occupational History   Not on file  Tobacco Use   Smoking status: Never   Smokeless tobacco: Never  Substance and Sexual Activity   Alcohol use: Yes    Alcohol/week: 1.0 standard drink of alcohol    Types: 1 Glasses of wine per week   Drug use: Never   Sexual activity: Not on file  Other Topics Concern   Not on file  Social History Narrative   Not on file   Social Determinants of Health   Financial Resource Strain: Low Risk  (10/22/2023)   Overall Financial Resource Strain (CARDIA)    Difficulty of Paying Living Expenses: Not hard at all  Food Insecurity: No Food Insecurity (10/22/2023)   Hunger Vital Sign    Worried About Running Out of Food in the Last Year: Never true    Ran Out of Food in the Last Year: Never true  Transportation Needs: No Transportation Needs (10/22/2023)   PRAPARE - Administrator, Civil Service (Medical): No    Lack of Transportation (Non-Medical): No  Physical Activity: Sufficiently Active (10/22/2023)   Exercise Vital Sign    Days of Exercise per Week: 5 days    Minutes of  Exercise per Session: 120 min  Stress: Stress Concern Present (10/22/2023)   [employer] of Occupational Health - Occupational Stress Questionnaire    Feeling of Stress : To some extent  Social Connections: Socially Isolated (10/22/2023)   Social Connection and Isolation Panel [NHANES]    Frequency of Communication with Friends and Family: Once a week    Frequency of Social Gatherings with Friends and Family: Once a week    Attends Religious Services: Never    Database administrator or Organizations: No    Attends Engineer, structural: Not on file    Marital Status: Married  Catering manager Violence: Not on file     Family History  Problem Relation Age of Onset   Cancer Mother    Heart disease Father    Alcohol abuse Father    Hypertension Sister    Hyperlipidemia  Sister    Heart disease Brother    Early death Brother    Diabetes Brother    Heart attack Brother    Heart attack Maternal Grandfather      ROS:  Please see the history of present illness.     All other systems reviewed and negative.    Physical Exam: Blood pressure 110/62, pulse (!) 55, height 5' 6.5" (1.689 m), weight 121 lb 3.2 oz (55 kg), SpO2 97%. General: Well developed, well nourished female in no acute distress. Head: Normocephalic, atraumatic, sclera non-icteric, no xanthomas, nares are without discharge. EENT: normal  Lymph Nodes:  none Neck: Negative for carotid bruits. JVD not elevated. Back:without scoliosis kyphosis Lungs: Clear bilaterally to auscultation without wheezes, rales, or rhonchi. Breathing is unlabored. Heart: RRR with S1 S2. No  murmur . No rubs, or gallops appreciated. Abdomen: Soft, non-tender, non-distended with normoactive bowel sounds. No hepatomegaly. No rebound/guarding. No obvious abdominal masses. Msk:  Strength and tone appear normal for age. Extremities: No clubbing or cyanosis. No  edema.  Distal pedal pulses are 2+ and equal bilaterally. Skin: Warm and Dry Neuro: Alert and oriented X 3. CN III-XII intact Grossly normal sensory and motor function . Psych:  Responds to questions appropriately with a normal affect.        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  The question is whether represents a clue as to underlying cardiomyopathy.  To that end we will undertake an echocardiogram, a cMRI and a calcium score, the the latter also given the elevated LDL.  Will also use a Zio patch to quantitate ventricular tachycardia episodes but also to help try to understand the mechanism of the  reentrant tachycardia which is associated with a posttermination sinus node suppression pause.  No discernible P wave.   [name]

## 2023-10-27 NOTE — Patient Instructions (Signed)
Medication Instructions:  The current medical regimen is effective;  continue present plan and medications.  *If you need a refill on your cardiac medications before your next appointment, please call your pharmacy*   Testing/Procedures: Your physician has requested that you have an echocardiogram. Echocardiography is a painless test that uses sound waves to create images of your heart. It provides your doctor with information about the size and shape of your heart and how well your heart's chambers and valves are working.   You may receive an ultrasound enhancing agent through an IV if needed to better visualize your heart during the echo. This procedure takes approximately one hour.  There are no restrictions for this procedure.  This will take place at [address] [facility] [address] [facility], [place] [address]  Please note: We ask at that you not bring children with you during ultrasound (echo/ vascular) testing. Due to room size and safety concerns, children are not allowed in the ultrasound rooms during exams. Our front office staff cannot provide observation of children in our lobby area while testing is being conducted. An adult accompanying a patient to their appointment will only be allowed in the ultrasound room at the discretion of the ultrasound technician under special circumstances. We apologize for any inconvenience.  Your physician has recommended that you have CT Coronary Calcium Score.  - $99 out of pocket cost at the time of your test - Call [phone] to schedule at your convenience.  [facility] [address] [place], [place] [address]   Coronary CalciumScan A coronary calcium scan is an imaging test used to look for deposits of calcium and other fatty materials (plaques) in the inner lining of the blood vessels of the heart (coronary arteries). These deposits of calcium and plaques can partly clog and narrow the  coronary arteries without producing any symptoms or warning signs. This puts a person at risk for a heart attack. This test can detect these deposits before symptoms develop. Tell a health care provider about: Any allergies you have. All medicines you are taking, including vitamins, herbs, eye drops, creams, and over-the-counter medicines. Any problems you or family members have had with anesthetic medicines. Any blood disorders you have. Any surgeries you have had. Any medical conditions you have. Whether you are pregnant or may be pregnant. What are the risks? Generally, this is a safe procedure. However, problems may occur, including: Harm to a pregnant woman and her unborn baby. This test involves the use of radiation. Radiation exposure can be dangerous to a pregnant woman and her unborn baby. If you are pregnant, you generally should not have this procedure done. Slight increase in the risk of cancer. This is because of the radiation involved in the test. What happens before the procedure? No preparation is needed for this procedure. What happens during the procedure? You will undress and remove any jewelry around your neck or chest. You will put on a hospital gown. Sticky electrodes will be placed on your chest. The electrodes will be connected to an electrocardiogram (ECG) machine to record a tracing of the electrical activity of your heart. A CT scanner will take pictures of your heart. During this time, you will be asked to lie still and hold your breath for 2-3 seconds while a picture of your heart is being taken. The procedure may vary among health care providers and hospitals. What happens after the procedure? You can get dressed. You can return to  your normal activities. It is up to you to get the results of your test. Ask your health care provider, or the department that is doing the test, when your results will be ready. Summary A coronary calcium scan is an imaging test  used to look for deposits of calcium and other fatty materials (plaques) in the inner lining of the blood vessels of the heart (coronary arteries). Generally, this is a safe procedure. Tell your health care provider if you are pregnant or may be pregnant. No preparation is needed for this procedure. A CT scanner will take pictures of your heart. You can return to your normal activities after the scan is done. This information is not intended to replace advice given to you by your health care provider. Make sure you discuss any questions you have with your health care provider. Document Released: 06/05/2008 Document Revised: 10/27/2016 Document Reviewed: 10/27/2016 Elsevier Interactive Patient Education  2017 Elsevier Inc.   Christena Deem- Long Term Monitor Instructions  Your physician has requested you wear a ZIO patch monitor for 14 days.  This is a single patch monitor. [employer] supplies one patch monitor per enrollment. Additional stickers are not available. Please do not apply patch if you will be having a Nuclear Stress Test,  Echocardiogram, Cardiac CT, MRI, or Chest Xray during the period you would be wearing the  monitor. The patch cannot be worn during these tests. You cannot remove and re-apply the  ZIO XT patch monitor.  Your ZIO patch monitor will be mailed 3 day USPS to your address on file. It may take 3-5 days  to receive your monitor after you have been enrolled.  Once you have received your monitor, please review the enclosed instructions. Your monitor  has already been registered assigning a specific monitor serial # to you.  Billing and Patient Assistance Program Information  We have supplied [employer] with any of your insurance information on file for billing purposes. [employer] offers a sliding scale Patient Assistance Program for patients that do not have  insurance, or whose insurance does not completely cover the cost of the ZIO monitor.  You must apply for the Patient  Assistance Program to qualify for this discounted rate.  To apply, please call [employer] at [phone], select option 4, select option 2, ask to apply for  Patient Assistance Program. Meredeth Ide will ask your household income, and how many people  are in your household. They will quote your out-of-pocket cost based on that information.  [employer] will also be able to set up a 61-month, interest-free payment plan if needed.  Applying the monitor   Shave hair from upper left chest.  Hold abrader disc by orange tab. Rub abrader in 40 strokes over the upper left chest as  indicated in your monitor instructions.  Clean area with 4 enclosed alcohol pads. Let dry.  Apply patch as indicated in monitor instructions. Patch will be placed under collarbone on left  side of chest with arrow pointing upward.  Rub patch adhesive wings for 2 minutes. Remove white label marked "1". Remove the white  label marked "2". Rub patch adhesive wings for 2 additional minutes.  While looking in a mirror, press and release button in center of patch. A small green light will  flash 3-4 times. This will be your only indicator that the monitor has been turned on.  Do not shower for the first 24 hours. You may shower after the first 24 hours.  Press the button if you  feel a symptom. You will hear a small click. Record Date, Time and  Symptom in the Patient Logbook.  When you are ready to remove the patch, follow instructions on the last 2 pages of Patient  Logbook. Stick patch monitor onto the last page of Patient Logbook.  Place Patient Logbook in the blue and white box. Use locking tab on box and tape box closed  securely. The blue and white box has prepaid postage on it. Please place it in the mailbox as  soon as possible. Your physician should have your test results approximately 7 days after the  monitor has been mailed back to [facility].  Call [facility] Customer Care at [phone] if you have questions  regarding  your ZIO XT patch monitor. Call them immediately if you see an orange light blinking on your  monitor.  If your monitor falls off in less than 4 days, contact our Monitor department at [phone].  If your monitor becomes loose or falls off after 4 days call [employer] at [phone] for  suggestions on securing your monitor    Your physician has requested that you have a cardiac MRI. Cardiac MRI uses a computer to create images of your heart as its beating, producing both still and moving pictures of your heart and major blood vessels. For further information please visit [email or web address]. Please follow the instruction sheet given to you today for more information.    Follow-Up: At [facility], you and your health needs are our priority.  As part of our continuing mission to provide you with exceptional heart care, we have created designated Provider Care Teams.  These Care Teams include your primary Cardiologist (physician) and Advanced Practice Providers (APPs -  Physician Assistants and Nurse Practitioners) who all work together to provide you with the care you need, when you need it.  We recommend signing up for the patient portal called "MyChart".  Sign up information is provided on this After Visit Summary.  MyChart is used to connect with patients for Virtual Visits (Telemedicine).  Patients are able to view lab/test results, encounter notes, upcoming appointments, etc.  Non-urgent messages can be sent to your provider as well.   To learn more about what you can do with MyChart, go to [email or web address].    Your next appointment:   As needed based on testing   Provider:   [name], MD

## 2023-10-31 DIAGNOSIS — I472 Ventricular tachycardia, unspecified: Secondary | ICD-10-CM

## 2023-10-31 DIAGNOSIS — R002 Palpitations: Secondary | ICD-10-CM | POA: Diagnosis not present

## 2023-11-16 ENCOUNTER — Ambulatory Visit
Admission: RE | Admit: 2023-11-16 | Discharge: 2023-11-16 | Disposition: A | Discharge status: Home / Self Care | Payer: No Typology Code available for payment source | Source: Ambulatory Visit | Attending: Internal Medicine | Admitting: Internal Medicine

## 2023-11-16 ENCOUNTER — Ambulatory Visit (HOSPITAL_COMMUNITY): Payer: Medicare Other | Attending: Cardiovascular Disease

## 2023-11-16 ENCOUNTER — Other Ambulatory Visit: Payer: Medicare Other

## 2023-11-16 DIAGNOSIS — R9431 Abnormal electrocardiogram [ECG] [EKG]: Secondary | ICD-10-CM | POA: Insufficient documentation

## 2023-11-16 DIAGNOSIS — R002 Palpitations: Secondary | ICD-10-CM

## 2023-11-16 DIAGNOSIS — I472 Ventricular tachycardia, unspecified: Secondary | ICD-10-CM | POA: Diagnosis present

## 2023-11-16 LAB — ECHOCARDIOGRAM COMPLETE: S' Lateral: 2.43 cm

## 2023-11-23 ENCOUNTER — Other Ambulatory Visit: Payer: Self-pay | Admitting: Family Medicine

## 2023-11-23 DIAGNOSIS — R9389 Abnormal findings on diagnostic imaging of other specified body structures: Secondary | ICD-10-CM

## 2023-12-01 ENCOUNTER — Encounter: Payer: Self-pay | Admitting: Internal Medicine

## 2023-12-03 ENCOUNTER — Telehealth: Payer: Self-pay

## 2023-12-03 DIAGNOSIS — R931 Abnormal findings on diagnostic imaging of heart and coronary circulation: Secondary | ICD-10-CM

## 2023-12-03 NOTE — Telephone Encounter (Signed)
-----   Message from [name] sent at 11/20/2023  3:18 PM EST ----- Please Inform Patient that CT  is abnormal and will require further eval; mild calcification of the arteries>> will need to do stress test to look at severity But also may have chronic indolent lung process and pulm referral recommended Wilber Oliphant do you want to own this so that you are in the mix? Thanks

## 2023-12-03 NOTE — Telephone Encounter (Signed)
Spoke with pt and reviewed results per Dr Graciela Husbands. Pt is agreeable to stress test.  Will need specific order from Dr Graciela Husbands regarding what kind of stress test he would like for pt to have.  Pt verbalizes understanding and agrees with current plan.

## 2023-12-04 ENCOUNTER — Encounter: Payer: Self-pay | Admitting: Family Medicine

## 2023-12-07 NOTE — Progress Notes (Signed)
OV 12/08/2023  Subjective:  Patient ID: Angie Davis, female , DOB: 11/19/1956 , age 67 y.o. , MRN: [id number] , ADDRESS: [address] [place] [address] PCP Ardith Dark, MD Patient Care Team: Ardith Dark, MD as PCP - General (Family Medicine)  This Provider for this visit: Treatment Team:  Attending Provider: Kalman Shan, MD    12/08/2023 -   Chief Complaint  Patient presents with   Consult    Discuss result of ct scan that was done ,pt has no concerns      HPI Angie Davis 67 y.o. - female.  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  She worked at [employer].  Her dad smoked but she never smoked.  At baseline she has no shortness of breath coughing or wheezing.  History is obtained from her and also review of the external medical record.  She is an avid athlete.  She she bicycles quite a bit and exercises quite a bit.  She she is lean with virtually no body fat.  She was able [place] bike from [place] 6 [place] in September 2024.  It was the end of October 2024 she suffered from palpitations.  The Apple Watch showed nonsustained ventricular tachycardia.  Then on 10/27/2023 she saw Dr. Hurman Horn.  ZIO monitor has been done results are pending.  A cardiac stress test is pending for February 2025.  She had a cardiac CT scan that showed scattered pulmonary nodules largest 5 mm and also some bronchiectasis very suggestive of MAI.  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  But she has no shortness of breath coughing or wheezing.  She is really frustrated and concerned about all these developments.  The only childhood illness room versus chickenpox she denies any measles.  The only other thing she remembers is that every couple of years she will get a cold and then she has sustained postviral reactive cough.  She  remembers having pulmonary function test couple of years ago for this at [facility] and this was normal.  Review of the external medical records indicate overactive bladder and recurrent UTI but I do not see nitrofurantoin prescription  - she has hisotyr of recc UTI and per history does NOT take nitrofurantoin  She is noticed to be on alendronate [can cause acid reflux] x 2.5 years  - does not have GERD   She has MILD PECTUS but not apparent on CT    CT Chest data from date: *11/16/23  - personally visualized and independently interpreted : yes - my findings are: as below  OTHER FINDINGS:   Widespread areas of cylindrical bronchiectasis with thickening of the peribronchovascular interstitium, regional architectural distortion and extensive peribronchovascular micro and macronodularity, with the largest nodule measuring up to 5 mm in the left lower lobe (axial image 42 of series 9). Within the visualized portions of the thorax there are no other larger more suspicious appearing pulmonary nodules or masses, there is no acute consolidative airspace disease, no pleural effusions, no pneumothorax and no lymphadenopathy. Visualized portions of the upper abdomen are unremarkable. There are no aggressive appearing lytic or blastic lesions noted in the visualized portions of the skeleton.   IMPRESSION: 1. Patient's total coronary artery calcium score is 62 which is 74th percentile for patient's of matched age, gender and race/ethnicity.  Please note that although the presence of coronary artery calcium documents the presence of coronary artery disease, the severity of this disease and any potential stenosis cannot be assessed on this noncontrast CT examination. Assessment for potential risk factor modification, dietary therapy or pharmacologic therapy may be warranted, if clinically indicated. 2. 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 Outpatient referral to Pulmonology for further clinical evaluation is recommended in the near future. Nodularity associated with these findings is favored to represent benign areas of mucoid impaction within terminal bronchioles, but attention on follow-up imaging is recommended to ensure the stability of these findings.     Electronically Signed   By: Trudie Reed M.D.   On: 11/18/2023 08:01    PFT      No data to display            LAB RESULTS last 96 hours No results found.  LAB RESULTS last 90 days Recent Results (from the past 2160 hours)  ECHOCARDIOGRAM COMPLETE     Status: None   Collection Time: 11/16/23  3:51 PM  Result Value Ref Range   S' Lateral 2.43 cm   Est EF 55 - 60%   CBC with Differential     Status: None   Collection Time: 12/08/23  3:03 PM  Result Value Ref Range   WBC 5.6 4.0 - 10.5 K/uL   RBC 4.47 3.87 - 5.11 Mil/uL   Hemoglobin 13.2 12.0 - 15.0 g/dL   HCT 47.8 29.5 - 62.1 %   MCV 89.3 78.0 - 100.0 fl   MCHC 33.0 30.0 - 36.0 g/dL   RDW 30.8 65.7 - 84.6 %   Platelets 253.0 150.0 - 400.0 K/uL   Neutrophils Relative % 62.5 43.0 - 77.0 %   Lymphocytes Relative 26.6 12.0 - 46.0 %   Monocytes Relative 8.6 3.0 - 12.0 %   Eosinophils Relative 1.6 0.0 - 5.0 %   Basophils Relative 0.7 0.0 - 3.0 %   Neutro Abs 3.5 1.4 - 7.7 K/uL   Lymphs Abs 1.5 0.7 - 4.0 K/uL   Monocytes Absolute 0.5 0.1 - 1.0 K/uL   Eosinophils Absolute 0.1 0.0 - 0.7 K/uL   Basophils Absolute 0.0 0.0 - 0.1 K/uL         has a past medical history of Cystitis, Heart murmur, and Osteoporosis.   reports that she has never smoked. She has never used smokeless tobacco.  Past Surgical History:  Procedure Laterality Date   ABDOMINAL HYSTERECTOMY     TUBAL LIGATION      No Known Allergies  Immunization History  Administered Date(s) Administered   Fluad Trivalent(High Dose 65+) 10/23/2023   Influenza Split  09/22/2013   Influenza-Unspecified 10/05/2014, 09/28/2015, 09/30/2016, 09/23/2018, 09/23/2019, 09/28/2020   PFIZER(Purple Top)SARS-COV-2 Vaccination 12/14/2019, 01/10/2020, 09/25/2020    Family History  Problem Relation Age of Onset   Cancer Mother    Heart disease Father    Alcohol abuse Father    Hypertension Sister    Hyperlipidemia Sister    Heart disease Brother    Early death Brother    Diabetes Brother    Heart attack Brother    Heart attack Maternal Grandfather      Current Outpatient Medications:    alendronate (FOSAMAX) 70 MG tablet, Take 1 tablet (70 mg total) by mouth once a week., Disp: 12 tablet, Rfl: 3   estradiol (ESTRACE) 0.1 MG/GM vaginal cream, Insert blueberry size  amount of cream on finger (or 0.5 grams with applicator) in vagina daily x2 week then 2x per week., Disp: , Rfl:    Magnesium 400 MG CAPS, , Disp: , Rfl:    methenamine (HIPREX) 1 g tablet, Take by mouth., Disp: , Rfl:       Objective:   Vitals:   12/08/23 1425  BP: 106/67  Pulse: 61  SpO2: 99%  Weight: 124 lb (56.2 kg)  Height: 5' 6.5" (1.689 m)    Estimated body mass index is 19.71 kg/m as calculated from the following:   Height as of this encounter: 5' 6.5" (1.689 m).   Weight as of this encounter: 124 lb (56.2 kg).  @WEIGHTCHANGE @  Filed Weights   12/08/23 1425  Weight: 124 lb (56.2 kg)     Physical Exam  Printing General: No distress. Thin, looks well O2 at rest: no Cane present: no Sitting in wheel chair: no Frail: no Obese: no Neuro: Alert and Oriented x 3. GCS 15. Speech normal Psych: Pleasant Resp:  Barrel Chest - no.  Wheeze - no, Crackles - no, No overt respiratory distress CVS: Normal heart sounds. Murmurs - no Ext: Stigmata of Connective Tissue Disease - no HEENT: Normal upper airway. PEERL +. No post nasal drip        Assessment:       ICD-10-CM   1. Bronchiectasis without complication (HCC)  J47.9 Antinuclear Antib (ANA)    Rheumatoid factor     Cyclic citrul peptide antibody, IgG    Sjogrens syndrome-A extractable nuclear antibody    Sjogrens syndrome-B extractable nuclear antibody    Alpha-1 antitrypsin phenotype    Quantiferon tb gold assay    IgG, IgA, IgM    IgE    CBC with Differential    Pulmonary function test    IgE    IgG, IgA, IgM    Cyclic citrul peptide antibody, IgG    Rheumatoid factor    Antinuclear Antib (ANA)    2. Multiple subsolid lung nodules less than 6 mm in diameter  R91.8 Antinuclear Antib (ANA)    Rheumatoid factor    Cyclic citrul peptide antibody, IgG    Sjogrens syndrome-A extractable nuclear antibody    Sjogrens syndrome-B extractable nuclear antibody    Alpha-1 antitrypsin phenotype    Quantiferon tb gold assay    IgG, IgA, IgM    IgE    CBC with Differential    Pulmonary function test    IgE    IgG, IgA, IgM    Cyclic citrul peptide antibody, IgG    Rheumatoid factor    Antinuclear Antib (ANA)         Plan:     Patient Instructions     ICD-10-CM   1. Bronchiectasis without complication (HCC)  J47.9 Antinuclear Antib (ANA)    Rheumatoid factor    Cyclic citrul peptide antibody, IgG    Sjogrens syndrome-A extractable nuclear antibody    Sjogrens syndrome-B extractable nuclear antibody    Alpha-1 antitrypsin phenotype    Quantiferon tb gold assay    IgG, IgA, IgM    IgE    CBC with Differential    Pulmonary function test    2. Multiple subsolid lung nodules less than 6 mm in diameter  R91.8 Antinuclear Antib (ANA)    Rheumatoid factor    Cyclic citrul peptide antibody, IgG    Sjogrens syndrome-A extractable nuclear antibody    Sjogrens syndrome-B extractable nuclear antibody    Alpha-1 antitrypsin  phenotype    Quantiferon tb gold assay    IgG, IgA, IgM    IgE    CBC with Differential    Pulmonary function test     There is very mild bronchiectasis and scattered nodules.  As explained these are features sometimes seen in thin women over 76 years of age.  The reasons  and the natural history for this is completely unclear.  Currently you are asymptomatic because of this.  The odds of this causing any pulmonary or life expectancy compromises extremely low   Plan - Do investigative workup -would work as above - Do full pulmonary function test  Follow-up - 2-49-month 15-minute visit [can be video visit] to discuss test results  -Based on the test results and symptoms and cardiac issues consider bronchoscopy with lavage versus continued observation  - inquire GERD history on alendrorate at followup  - inquire history of any nitrofurantoin intake at [facility] for her recurrent UTIs if applicable.    FOLLOWUP Return in about 10 weeks (around 02/16/2024) for 15 min visit, with Dr Marchelle Gearing, Face to Face OR Video Visit.    SIGNATURE    Dr. Kalman Shan, M.D., F.C.C.P,  Pulmonary and Critical Care Medicine Staff Physician, [facility] Center Director - Interstitial Lung Disease  Program  Pulmonary Fibrosis [facility] at [facility] [place], [place], [address]  Pager: [phone], If no answer or between  15:00h - 7:00h: call [phone] Telephone: [phone]  7:10 PM 12/08/2023

## 2023-12-08 ENCOUNTER — Ambulatory Visit (INDEPENDENT_AMBULATORY_CARE_PROVIDER_SITE_OTHER): Payer: Medicare Other | Admitting: Internal Medicine

## 2023-12-08 ENCOUNTER — Encounter (HOSPITAL_COMMUNITY): Payer: Self-pay

## 2023-12-08 ENCOUNTER — Encounter: Payer: Self-pay | Admitting: Internal Medicine

## 2023-12-08 VITALS — BP 106/67 | HR 61 | Ht 66.5 in | Wt 124.0 lb

## 2023-12-08 DIAGNOSIS — J479 Bronchiectasis, uncomplicated: Secondary | ICD-10-CM

## 2023-12-08 DIAGNOSIS — R918 Other nonspecific abnormal finding of lung field: Secondary | ICD-10-CM

## 2023-12-08 LAB — CBC WITH DIFFERENTIAL/PLATELET
Basophils Absolute: 0 10*3/uL (ref 0.0–0.1)
Basophils Relative: 0.7 % (ref 0.0–3.0)
Eosinophils Absolute: 0.1 10*3/uL (ref 0.0–0.7)
Eosinophils Relative: 1.6 % (ref 0.0–5.0)
HCT: 39.9 % (ref 36.0–46.0)
Hemoglobin: 13.2 g/dL (ref 12.0–15.0)
Lymphocytes Relative: 26.6 % (ref 12.0–46.0)
Lymphs Abs: 1.5 10*3/uL (ref 0.7–4.0)
MCHC: 33 g/dL (ref 30.0–36.0)
MCV: 89.3 fL (ref 78.0–100.0)
Monocytes Absolute: 0.5 10*3/uL (ref 0.1–1.0)
Monocytes Relative: 8.6 % (ref 3.0–12.0)
Neutro Abs: 3.5 10*3/uL (ref 1.4–7.7)
Neutrophils Relative %: 62.5 % (ref 43.0–77.0)
Platelets: 253 10*3/uL (ref 150.0–400.0)
RBC: 4.47 Mil/uL (ref 3.87–5.11)
RDW: 12.7 % (ref 11.5–15.5)
WBC: 5.6 10*3/uL (ref 4.0–10.5)

## 2023-12-08 NOTE — Patient Instructions (Addendum)
ICD-10-CM   1. Bronchiectasis without complication (HCC)  J47.9 Antinuclear Antib (ANA)    Rheumatoid factor    Cyclic citrul peptide antibody, IgG    Sjogrens syndrome-A extractable nuclear antibody    Sjogrens syndrome-B extractable nuclear antibody    Alpha-1 antitrypsin phenotype    Quantiferon tb gold assay    IgG, IgA, IgM    IgE    CBC with Differential    Pulmonary function test    2. Multiple subsolid lung nodules less than 6 mm in diameter  R91.8 Antinuclear Antib (ANA)    Rheumatoid factor    Cyclic citrul peptide antibody, IgG    Sjogrens syndrome-A extractable nuclear antibody    Sjogrens syndrome-B extractable nuclear antibody    Alpha-1 antitrypsin phenotype    Quantiferon tb gold assay    IgG, IgA, IgM    IgE    CBC with Differential    Pulmonary function test     There is very mild bronchiectasis and scattered nodules.  As explained these are features sometimes seen in thin women over 66 years of age.  The reasons and the natural history for this is completely unclear.  Currently you are asymptomatic because of this.  The odds of this causing any pulmonary or life expectancy compromises extremely low   Plan - Do investigative workup -would work as above - Do full pulmonary function test  Follow-up - 2-23-month 15-minute visit [can be video visit] to discuss test results  -Based on the test results and symptoms and cardiac issues consider bronchoscopy with lavage versus continued observation

## 2023-12-10 ENCOUNTER — Other Ambulatory Visit: Payer: Self-pay | Admitting: Internal Medicine

## 2023-12-10 ENCOUNTER — Ambulatory Visit (HOSPITAL_COMMUNITY)
Admission: RE | Admit: 2023-12-10 | Discharge: 2023-12-10 | Disposition: A | Discharge status: Home / Self Care | Payer: Medicare Other | Source: Ambulatory Visit | Attending: Internal Medicine | Admitting: Internal Medicine

## 2023-12-10 DIAGNOSIS — I472 Ventricular tachycardia, unspecified: Secondary | ICD-10-CM

## 2023-12-10 DIAGNOSIS — R002 Palpitations: Secondary | ICD-10-CM | POA: Diagnosis not present

## 2023-12-10 LAB — IGG, IGA, IGM
IgG (Immunoglobin G), Serum: 1046 mg/dL (ref 600–1540)
IgM, Serum: 158 mg/dL (ref 50–300)
Immunoglobulin A: 145 mg/dL (ref 70–320)

## 2023-12-10 LAB — CYCLIC CITRUL PEPTIDE ANTIBODY, IGG: Cyclic Citrullin Peptide Ab: <16 U

## 2023-12-10 LAB — ANA: Anti Nuclear Antibody (ANA): NEGATIVE

## 2023-12-10 LAB — RHEUMATOID FACTOR: Rheumatoid fact SerPl-aCnc: 11 [IU]/mL (ref <14)

## 2023-12-10 LAB — IGE: IgE (Immunoglobulin E), Serum: 11 kU/L (ref <=114)

## 2023-12-10 MED ORDER — GADOBUTROL 1 MMOL/ML IV SOLN
8.5000 mL | Freq: Once | INTRAVENOUS | Status: AC | PRN
  Administered 2023-12-10: 8.5 mL via INTRAVENOUS

## 2023-12-18 NOTE — Telephone Encounter (Signed)
done

## 2023-12-24 LAB — ALPHA-1 ANTITRYPSIN PHENOTYPE: A-1 Antitrypsin, Ser: 137 mg/dL (ref 83–199)

## 2023-12-24 LAB — SJOGRENS SYNDROME-A EXTRACTABLE NUCLEAR ANTIBODY: SSA (Ro) (ENA) Antibody, IgG: <1 AI

## 2023-12-24 LAB — SJOGRENS SYNDROME-B EXTRACTABLE NUCLEAR ANTIBODY: SSB (La) (ENA) Antibody, IgG: <1 AI

## 2024-01-04 ENCOUNTER — Other Ambulatory Visit (HOSPITAL_COMMUNITY): Payer: Medicare Other

## 2024-01-27 ENCOUNTER — Ambulatory Visit: Payer: Medicare Other | Attending: Internal Medicine

## 2024-01-27 ENCOUNTER — Ambulatory Visit: Payer: Medicare Other | Admitting: Internal Medicine

## 2024-01-27 DIAGNOSIS — R931 Abnormal findings on diagnostic imaging of heart and coronary circulation: Secondary | ICD-10-CM

## 2024-01-27 LAB — EXERCISE TOLERANCE TEST
Angina Index: 0
Estimated workload: 13.9
Exercise duration (min): 10 min
Exercise duration (sec): 36 s
MPHR: 154 {beats}/min
Peak HR: 144 {beats}/min
Percent HR: 93 %
RPE: 15
Rest HR: 65 {beats}/min

## 2024-02-29 ENCOUNTER — Telehealth: Payer: Medicare Other | Admitting: Internal Medicine

## 2024-03-25 LAB — CBC AND DIFFERENTIAL
HCT: 38 (ref 36–46)
Hemoglobin: 12.6 (ref 12.0–16.0)
Platelets: 255 10*3/uL (ref 150–400)
WBC: 5.5

## 2024-03-25 LAB — TSH: TSH: 1.84 (ref 0.41–5.90)

## 2024-03-25 LAB — CBC: RBC: 4.23 (ref 3.87–5.11)

## 2024-03-28 ENCOUNTER — Encounter: Payer: Self-pay | Admitting: Family Medicine

## 2024-04-06 LAB — HM COLONOSCOPY

## 2024-06-26 ENCOUNTER — Ambulatory Visit: Payer: Self-pay | Admitting: Internal Medicine

## 2024-06-26 NOTE — Progress Notes (Signed)
 Seen for bronchitectasis in dec 2024. She has no followup. Results were normal

## 2024-06-28 NOTE — Progress Notes (Signed)
Spoke with pt and notified of results per Dr. Ramaswamy. Pt verbalized understanding and denied any questions.  

## 2024-07-08 ENCOUNTER — Encounter: Payer: Self-pay | Admitting: Family Medicine

## 2024-07-08 ENCOUNTER — Ambulatory Visit (INDEPENDENT_AMBULATORY_CARE_PROVIDER_SITE_OTHER): Payer: Medicare Other | Admitting: Family Medicine

## 2024-07-08 VITALS — BP 100/70 | HR 69 | Temp 97.6°F | Ht 66.5 in | Wt 124.0 lb

## 2024-07-08 DIAGNOSIS — E785 Hyperlipidemia, unspecified: Secondary | ICD-10-CM

## 2024-07-08 DIAGNOSIS — R002 Palpitations: Secondary | ICD-10-CM

## 2024-07-08 DIAGNOSIS — G4700 Insomnia, unspecified: Secondary | ICD-10-CM

## 2024-07-08 DIAGNOSIS — M810 Age-related osteoporosis without current pathological fracture: Secondary | ICD-10-CM | POA: Diagnosis not present

## 2024-07-08 DIAGNOSIS — R739 Hyperglycemia, unspecified: Secondary | ICD-10-CM

## 2024-07-08 LAB — LIPID PANEL
Cholesterol: 202 mg/dL — ABNORMAL HIGH (ref 0–200)
HDL: 71.9 mg/dL (ref >39.00)
LDL Cholesterol: 111 mg/dL — ABNORMAL HIGH (ref 0–99)
NonHDL: 130.4
Total CHOL/HDL Ratio: 3
Triglycerides: 97 mg/dL (ref 0.0–149.0)
VLDL: 19.4 mg/dL (ref 0.0–40.0)

## 2024-07-08 LAB — TSH: TSH: 2.49 u[IU]/mL (ref 0.35–5.50)

## 2024-07-08 LAB — COMPREHENSIVE METABOLIC PANEL WITH GFR
ALT: 14 U/L (ref 0–35)
AST: 21 U/L (ref 0–37)
Albumin: 4.3 g/dL (ref 3.5–5.2)
Alkaline Phosphatase: 63 U/L (ref 39–117)
BUN: 14 mg/dL (ref 6–23)
CO2: 27 meq/L (ref 19–32)
Calcium: 9.1 mg/dL (ref 8.4–10.5)
Chloride: 101 meq/L (ref 96–112)
Creatinine, Ser: 0.7 mg/dL (ref 0.40–1.20)
GFR: 89.91 mL/min (ref >60.00)
Glucose, Bld: 86 mg/dL (ref 70–99)
Potassium: 4 meq/L (ref 3.5–5.1)
Sodium: 135 meq/L (ref 135–145)
Total Bilirubin: 0.5 mg/dL (ref 0.2–1.2)
Total Protein: 7 g/dL (ref 6.0–8.3)

## 2024-07-08 LAB — CBC
HCT: 38.1 % (ref 36.0–46.0)
Hemoglobin: 12.5 g/dL (ref 12.0–15.0)
MCHC: 32.8 g/dL (ref 30.0–36.0)
MCV: 88.6 fl (ref 78.0–100.0)
Platelets: 210 K/uL (ref 150.0–400.0)
RBC: 4.3 Mil/uL (ref 3.87–5.11)
RDW: 13 % (ref 11.5–15.5)
WBC: 4.6 K/uL (ref 4.0–10.5)

## 2024-07-08 LAB — HEMOGLOBIN A1C: Hgb A1c MFr Bld: 6.1 % (ref 4.6–6.5)

## 2024-07-08 NOTE — Progress Notes (Signed)
 Angie Davis is a 68 y.o. female who presents today for an office visit.  Assessment/Plan:  Chronic Problems Addressed Today: Dyslipidemia Check lipids.  She is doing great job with lifestyle modifications.  Hyperglycemia Check A1c.  Osteoporosis Last DEXA 2024 DEXA scan last year showed T-score -2.4.  She will continue Fosamax  and we can repeat DEXA scan in 2026.  Palpitation She has seen cardiology since her last visit and was diagnosed with SVT.  Remainder of her workup was negative.  Symptoms are currently manageable.  She is avoiding beta-blocker or other treatment at this point.  She is avoiding triggers such as dehydration and caffeine.  Insomnia Overall symptoms are manageable with over-the-counter Unisom as needed.  She is tolerating well without any significant side effects.  She will continue taking this for now.  If she develops side effects or if symptoms are not managed with this would consider trial of doxepin, gabapentin, or Ambien in the future.  She has had trazodone and Vistaril in the past which has not been effective.     Subjective:  HPI:  See assessment / plan for status of chronic conditions. She is doing well today. Since our last visit she has followed with cardiology for palpitations.  Diagnosed with SVT.  Symptoms are currently manageable.  Her other workup was negative.  ROS: Per HPI, otherwise a complete review of systems was negative.  PMH:  The following were reviewed and entered/updated in epic: Past Medical History:  Diagnosis Date   Cystitis    Heart murmur    Osteoporosis    Patient Active Problem List   Diagnosis Date Noted   Recurrent cystitis 07/08/2023   Hyperglycemia 07/08/2023   Osteoporosis Last DEXA 2024 07/08/2023   Dyslipidemia 07/08/2023   Lichen planus 07/08/2023   Palpitation 07/08/2023   Insomnia 07/08/2023   Past Surgical History:  Procedure Laterality Date   ABDOMINAL HYSTERECTOMY     TUBAL LIGATION       Family History  Problem Relation Age of Onset   Cancer Mother    Heart disease Father    Alcohol abuse Father    Hypertension Sister    Hyperlipidemia Sister    Heart disease Brother    Early death Brother    Diabetes Brother    Heart attack Brother    Heart attack Maternal Grandfather     Medications- reviewed and updated Current Outpatient Medications  Medication Sig Dispense Refill   alendronate  (FOSAMAX ) 70 MG tablet Take 1 tablet (70 mg total) by mouth once a week. 12 tablet 3   Ascorbic Acid (VITAMIN C WITH ROSE HIPS) 1000 MG tablet Take 1,000 mg by mouth daily.     Biotin 1000 MCG CHEW Chew by mouth.     Cranberry 200 MG CAPS Take 1 tablet by mouth daily.     cyanocobalamin (VITAMIN B12) 1000 MCG tablet Take 1,000 mcg by mouth daily.     estradiol (ESTRACE) 0.1 MG/GM vaginal cream Insert blueberry size amount of cream on finger (or 0.5 grams with applicator) in vagina daily x2 week then 2x per week.     Magnesium 400 MG CAPS      Turmeric (QC TUMERIC COMPLEX PO) Take by mouth.     methenamine (HIPREX) 1 g tablet Take by mouth.     No current facility-administered medications for this visit.    Allergies-reviewed and updated No Known Allergies  Social History   Socioeconomic History   Marital status: Married  Spouse name: Not on file   Number of children: Not on file   Years of education: Not on file   Highest education level: Master's degree (e.g., MA, MS, MEng, MEd, MSW, MBA)  Occupational History   Not on file  Tobacco Use   Smoking status: Never   Smokeless tobacco: Never  Substance and Sexual Activity   Alcohol use: Yes    Alcohol/week: 1.0 standard drink of alcohol    Types: 1 Glasses of wine per week   Drug use: Never   Sexual activity: Not on file  Other Topics Concern   Not on file  Social History Narrative   Not on file   Social Drivers of Health   Financial Resource Strain: Low Risk  (10/22/2023)   Overall Financial Resource Strain  (CARDIA)    Difficulty of Paying Living Expenses: Not hard at all  Food Insecurity: No Food Insecurity (10/22/2023)   Hunger Vital Sign    Worried About Running Out of Food in the Last Year: Never true    Ran Out of Food in the Last Year: Never true  Transportation Needs: No Transportation Needs (10/22/2023)   PRAPARE - Administrator, Civil Service (Medical): No    Lack of Transportation (Non-Medical): No  Physical Activity: Sufficiently Active (10/22/2023)   Exercise Vital Sign    Days of Exercise per Week: 5 days    Minutes of Exercise per Session: 120 min  Stress: Stress Concern Present (10/22/2023)   [employer] of Occupational Health - Occupational Stress Questionnaire    Feeling of Stress : To some extent  Social Connections: Socially Isolated (10/22/2023)   Social Connection and Isolation Panel    Frequency of Communication with Friends and Family: Once a week    Frequency of Social Gatherings with Friends and Family: Once a week    Attends Religious Services: Never    Database administrator or Organizations: No    Attends Engineer, structural: Not on file    Marital Status: Married          Objective:  Physical Exam: BP 100/70   Pulse 69   Temp 97.6 F (36.4 C)   Ht 5' 6.5 (1.689 m)   Wt 124 lb (56.2 kg)   SpO2 96%   BMI 19.71 kg/m   Gen: No acute distress, resting comfortably CV: Regular rate and rhythm with no murmurs appreciated Pulm: Normal work of breathing, clear to auscultation bilaterally with no crackles, wheezes, or rhonchi Neuro: Grossly normal, moves all extremities Psych: Normal affect and thought content      Gleen Ripberger M. Kennyth, MD 07/08/2024 8:24 AM

## 2024-07-08 NOTE — Assessment & Plan Note (Signed)
 She has seen cardiology since her last visit and was diagnosed with SVT.  Remainder of her workup was negative.  Symptoms are currently manageable.  She is avoiding beta-blocker or other treatment at this point.  She is avoiding triggers such as dehydration and caffeine.

## 2024-07-08 NOTE — Assessment & Plan Note (Signed)
 DEXA scan last year showed T-score -2.4.  She will continue Fosamax  and we can repeat DEXA scan in 2026.

## 2024-07-08 NOTE — Assessment & Plan Note (Signed)
 Overall symptoms are manageable with over-the-counter Unisom as needed.  She is tolerating well without any significant side effects.  She will continue taking this for now.  If she develops side effects or if symptoms are not managed with this would consider trial of doxepin, gabapentin, or Ambien in the future.  She has had trazodone and Vistaril in the past which has not been effective.

## 2024-07-08 NOTE — Patient Instructions (Signed)
 It was very nice to see you today!  We will check blood work today.  Please keep up the great work with your diet and exercise.  We can continue the Fosamax  for now until we repeat your bone density scan in a couple of years.  Will see you back in year for your next physical.  Come back sooner if needed.  Return in about 1 year (around 07/08/2025) for Annual Physical.   Take care, Dr Kennyth  PLEASE NOTE:  If you had any lab tests, please let us  know if you have not heard back within a few days. You may see your results on mychart before we have a chance to review them but we will give you a call once they are reviewed by us .   If we ordered any referrals today, please let us  know if you have not heard from their office within the next week.   If you had any urgent prescriptions sent in today, please check with the pharmacy within an hour of our visit to make sure the prescription was transmitted appropriately.   Please try these tips to maintain a healthy lifestyle:  Eat at least 3 REAL meals and 1-2 snacks per day.  Aim for no more than 5 hours between eating.  If you eat breakfast, please do so within one hour of getting up.   Each meal should contain half fruits/vegetables, one quarter protein, and one quarter carbs (no bigger than a computer mouse)  Cut down on sweet beverages. This includes juice, soda, and sweet tea.   Drink at least 1 glass of water with each meal and aim for at least 8 glasses per day  Exercise at least 150 minutes every week.    Preventive Care 61 Years and Older, Female Preventive care refers to lifestyle choices and visits with your health care provider that can promote health and wellness. Preventive care visits are also called wellness exams. What can I expect for my preventive care visit? Counseling Your health care provider may ask you questions about your: Medical history, including: Past medical problems. Family medical history. Pregnancy  and menstrual history. History of falls. Current health, including: Memory and ability to understand (cognition). Emotional well-being. Home life and relationship well-being. Sexual activity and sexual health. Lifestyle, including: Alcohol, nicotine or tobacco, and drug use. Access to firearms. Diet, exercise, and sleep habits. Work and work Astronomer. Sunscreen use. Safety issues such as seatbelt and bike helmet use. Physical exam Your health care provider will check your: Height and weight. These may be used to calculate your BMI (body mass index). BMI is a measurement that tells if you are at a healthy weight. Waist circumference. This measures the distance around your waistline. This measurement also tells if you are at a healthy weight and may help predict your risk of certain diseases, such as type 2 diabetes and high blood pressure. Heart rate and blood pressure. Body temperature. Skin for abnormal spots. What immunizations do I need?  Vaccines are usually given at various ages, according to a schedule. Your health care provider will recommend vaccines for you based on your age, medical history, and lifestyle or other factors, such as travel or where you work. What tests do I need? Screening Your health care provider may recommend screening tests for certain conditions. This may include: Lipid and cholesterol levels. Hepatitis C test. Hepatitis B test. HIV (human immunodeficiency virus) test. STI (sexually transmitted infection) testing, if you are at risk. Lung cancer  screening. Colorectal cancer screening. Diabetes screening. This is done by checking your blood sugar (glucose) after you have not eaten for a while (fasting). Mammogram. Talk with your health care provider about how often you should have regular mammograms. BRCA-related cancer screening. This may be done if you have a family history of breast, ovarian, tubal, or peritoneal cancers. Bone density scan. This  is done to screen for osteoporosis. Talk with your health care provider about your test results, treatment options, and if necessary, the need for more tests. Follow these instructions at home: Eating and drinking  Eat a diet that includes fresh fruits and vegetables, whole grains, lean protein, and low-fat dairy products. Limit your intake of foods with high amounts of sugar, saturated fats, and salt. Take vitamin and mineral supplements as recommended by your health care provider. Do not drink alcohol if your health care provider tells you not to drink. If you drink alcohol: Limit how much you have to 0-1 drink a day. Know how much alcohol is in your drink. In the [place] one drink equals one 12 oz bottle of beer (355 mL), one 5 oz glass of wine (148 mL), or one 1 oz glass of hard liquor (44 mL). Lifestyle Brush your teeth every morning and night with fluoride toothpaste. Floss one time each day. Exercise for at least 30 minutes 5 or more days each week. Do not use any products that contain nicotine or tobacco. These products include cigarettes, chewing tobacco, and vaping devices, such as e-cigarettes. If you need help quitting, ask your health care provider. Do not use drugs. If you are sexually active, practice safe sex. Use a condom or other form of protection in order to prevent STIs. Take aspirin only as told by your health care provider. Make sure that you understand how much to take and what form to take. Work with your health care provider to find out whether it is safe and beneficial for you to take aspirin daily. Ask your health care provider if you need to take a cholesterol-lowering medicine (statin). Find healthy ways to manage stress, such as: Meditation, yoga, or listening to music. Journaling. Talking to a trusted person. Spending time with friends and family. Minimize exposure to UV radiation to reduce your risk of skin cancer. Safety Always wear your seat belt while  driving or riding in a vehicle. Do not drive: If you have been drinking alcohol. Do not ride with someone who has been drinking. When you are tired or distracted. While texting. If you have been using any mind-altering substances or drugs. Wear a helmet and other protective equipment during sports activities. If you have firearms in your house, make sure you follow all gun safety procedures. What's next? Visit your health care provider once a year for an annual wellness visit. Ask your health care provider how often you should have your eyes and teeth checked. Stay up to date on all vaccines. This information is not intended to replace advice given to you by your health care provider. Make sure you discuss any questions you have with your health care provider. Document Revised: 06/05/2021 Document Reviewed: 06/05/2021 Elsevier Patient Education  2024 [employer].

## 2024-07-08 NOTE — Assessment & Plan Note (Signed)
 Check lipids.  She is doing great job with lifestyle modifications.

## 2024-07-08 NOTE — Assessment & Plan Note (Signed)
 Check A1c.

## 2024-07-13 ENCOUNTER — Ambulatory Visit: Payer: Self-pay | Admitting: Family Medicine

## 2024-07-13 NOTE — Progress Notes (Signed)
 Cholesterol is borderline elevated but better than last year.  A1c is also borderline elevated at 6.1.  She does not need to start meds for either of these however should continue to work on diet and exercise and we can recheck in a year.  Everything else is normal and we can recheck in a year.

## 2024-09-23 ENCOUNTER — Other Ambulatory Visit: Payer: Self-pay | Admitting: Family Medicine

## 2024-09-23 NOTE — Telephone Encounter (Signed)
 Please review refill request
